# Patient Record
Sex: Male | Born: 1990 | Race: White | Hispanic: No | Marital: Single | Smoking: Current every day smoker
Health system: Southern US, Community
[De-identification: ages and names within clinical notes are randomized; demographics above are authoritative.]

---
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
## 2013-01-16 ENCOUNTER — Emergency Department (HOSPITAL_COMMUNITY): Payer: Self-pay

## 2013-01-16 ENCOUNTER — Encounter (HOSPITAL_COMMUNITY): Payer: Self-pay | Admitting: Emergency Medicine

## 2013-01-16 ENCOUNTER — Emergency Department (HOSPITAL_COMMUNITY)
Admission: EM | Admit: 2013-01-16 | Discharge: 2013-01-16 | Disposition: A | Discharge status: Home / Self Care | Payer: Self-pay | Attending: Emergency Medicine | Admitting: Emergency Medicine

## 2013-01-16 DIAGNOSIS — Y92410 Unspecified street and highway as the place of occurrence of the external cause: Secondary | ICD-10-CM | POA: Insufficient documentation

## 2013-01-16 DIAGNOSIS — M25511 Pain in right shoulder: Secondary | ICD-10-CM

## 2013-01-16 DIAGNOSIS — S4980XA Other specified injuries of shoulder and upper arm, unspecified arm, initial encounter: Secondary | ICD-10-CM | POA: Insufficient documentation

## 2013-01-16 DIAGNOSIS — F1010 Alcohol abuse, uncomplicated: Secondary | ICD-10-CM | POA: Insufficient documentation

## 2013-01-16 DIAGNOSIS — Y9389 Activity, other specified: Secondary | ICD-10-CM | POA: Insufficient documentation

## 2013-01-16 DIAGNOSIS — F17200 Nicotine dependence, unspecified, uncomplicated: Secondary | ICD-10-CM | POA: Insufficient documentation

## 2013-01-16 DIAGNOSIS — S46909A Unspecified injury of unspecified muscle, fascia and tendon at shoulder and upper arm level, unspecified arm, initial encounter: Secondary | ICD-10-CM | POA: Insufficient documentation

## 2013-01-16 DIAGNOSIS — M791 Myalgia, unspecified site: Secondary | ICD-10-CM
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
## 2013-01-16 NOTE — Discharge Instructions (Signed)
Please call and followup with health and wellness Center, as well as orthopedics regarding right shoulder discomfort Please rest and stay hydrated Please avoid any strength activity Please ice shoulder at least 3-4 times per day Please perform slow circular motions to the right shoulder to aid in muscular relief Please use ibuprofen as needed for discomfort Please watch for signs of possible concussion-blurred vision, sudden loss of vision, headache, nausea, vomiting Please continue monitor symptoms and if symptoms are to worsen or change (fever greater than 101, chills, chest pain, shortness of breath, difficulty breathing, numbness and arm, weakness, inability to grasp objects, fall, reinjury) please report back to emergency department immediately  Shoulder Pain The shoulder is the joint that connects your arms to your body. The bones that form the shoulder joint include the upper arm bone (humerus), the shoulder blade (scapula), and the collarbone (clavicle). The top of the humerus is shaped like a ball and fits into a rather flat socket on the scapula (glenoid cavity). A combination of muscles and strong, fibrous tissues that connect muscles to bones (tendons) support your shoulder joint and hold the ball in the socket. Small, fluid-filled sacs (bursae) are located in different areas of the joint. They act as cushions between the bones and the overlying soft tissues and help reduce friction between the gliding tendons and the bone as you move your arm. Your shoulder joint allows a wide range of motion in your arm. This range of motion allows you to do things like scratch your back or throw a ball. However, this range of motion also makes your shoulder more prone to pain from overuse and injury. Causes of shoulder pain can originate from both injury and overuse and usually can be grouped in the following four categories:  Redness, swelling, and pain (inflammation) of the tendon (tendinitis) or the  bursae (bursitis).  Instability, such as a dislocation of the joint.  Inflammation of the joint (arthritis).  Broken bone (fracture). HOME CARE INSTRUCTIONS   Apply ice to the sore area.  Put ice in a plastic bag.  Place a towel between your skin and the bag.  Leave the ice on for 15-20 minutes, 3-4 times per day for the first 2 days.  Stop using cold packs if they do not help with the pain.  If you have a shoulder sling or immobilizer, wear it as long as your caregiver instructs. Only remove it to shower or bathe. Move your arm as little as possible, but keep your hand moving to prevent swelling.  Squeeze a soft ball or foam pad as much as possible to help prevent swelling.  Only take over-the-counter or prescription medicines for pain, discomfort, or fever as directed by your caregiver. SEEK MEDICAL CARE IF:   Your shoulder pain increases, or new pain develops in your arm, hand, or fingers.  Your hand or fingers become cold and numb.  Your pain is not relieved with medicines. SEEK IMMEDIATE MEDICAL CARE IF:   Your arm, hand, or fingers are numb or tingling.  Your arm, hand, or fingers are significantly swollen or turn white or blue. MAKE SURE YOU:   Understand these instructions.  Will watch your condition.  Will get help right away if you are not doing well or get worse. Document Released: 12/25/2004 Document Revised: 12/10/2011 Document Reviewed: 03/01/2011 [facility] Patient Information 2014 [place], [place].  RESOURCE GUIDE  Chronic Pain Problems: Contact [facility]  [phone] Patients need to be referred by their primary  care doctor.  Insufficient Money for Medicine: Contact United Way:  call [phone]  No Primary Care Doctor: - Call Health Connect  [phone] - can help you locate a primary care doctor that  accepts your insurance, provides certain services, etc. - Physician Referral Service- [phone]  Agencies that provide  inexpensive medical care: - [facility]  [phone] - [facility]  [phone] - [facility]  [phone] - Women's Clinic  [phone] - Planned Parenthood  [phone] [facility]  [phone]  Medicaid-accepting [facility] Providers: - [facility]- [address]  [phone], Mon-Fri 9am-7pm, Sat 9am-1pm - [facility]- [address] [place], Suite [place]  [phone] - [facility]- [address], Suite [place]  [phone] [facility]- [address]  [phone] - Renaye Rakers- [address] [place], [address], [phone]  Only accepts [place] patients after they have their name  applied to their card  Self Pay (no insurance) in [place]: - Sickle Cell Patients - [facility]  [address] [place], [phone] - [facility]- [address] [place]  [phone]       [facility] [place]- [address]       -     [facility]- see information above (Speak to [employer] if you do not have insurance)       -  [facility]- [address],  [phone]       -  [facility]- [address] [phone]       -  Dr Julio Sicks-  [address], [place], [phone]       -  [facility] - [address], [phone]       -  [facility]- [address] [phone], also [address], [phone]       -     [facility]- [address] [place], [phone], 1st & 3rd Saturday         every month, 10am-1pm  -     [facility]   [address] [place], [place].   Phone:  [phone], Fax:  [phone]. Hours of Operation:  9 am - 6 pm, M-F.  -     [facility]   [address], [place]   Phone: [phone], Fax: [phone]. Hours of Operation:  8:30 am -  5:30 pm, M-F.  [facility] [address] [place], [place] [address] [phone]  [facility] [address] [place], [place] [address] [phone]  1) Find a Doctor and Pay Out of Pocket Although you won't have to find out who is covered by your insurance plan, it is a good idea to ask around and get recommendations. You will then need to call the office and see if the doctor you have chosen will accept you as a new patient and what types of options they offer for patients who are self-pay. Some doctors offer discounts or will set up payment plans for their patients who do not have insurance, but you will need to ask so you aren't surprised when you get to your appointment.  2) Contact Your Local Health  Department Not all health departments have doctors that can see patients for sick visits, but many do, so it is worth a call to see if yours does. If you don't know where your local health department is, you can check in your phone book. The CDC also has a tool to help you locate your state's health department, and many state websites also have listings of all of their local health departments.  3) Find a Walk-in Clinic If your illness is not likely to be very severe or complicated, you may want to try a walk in clinic. These are popping up all over the country in pharmacies, drugstores, and shopping centers. They're usually staffed by nurse practitioners or physician assistants that have been trained to treat common illnesses and complaints. They're usually fairly quick and inexpensive. However, if you have serious medical issues or chronic medical problems, these are probably not your best option  STD Testing - [facility] Department of [facility] [place], STD Clinic, [address] [place], phone [phone] or [phone].  Monday - Friday, call for an appointment. [facility] Department of [employer], STD Clinic,  [address], [place], phone [phone] or [phone].  Monday - Friday, call for an appointment.  Abuse/Neglect: [facility] Child Abuse Hotline (825)384-9573 [facility] Child Abuse Hotline (432)039-2352 (After Hours)  Emergency Shelter:  [facility] [phone]  Maternity Homes: - Room at the [address] [facility] [phone] - [facility] [phone]  MRSA Hotline #:   267-109-1146  Dental Assistance If unable to pay or uninsured, contact:  [facility]. to become qualified for the adult dental clinic.  Patients with Medicaid: [facility] [phone] [address], [phone] [address], [phone]  If unable to pay, or uninsured, contact [facility] [phone] in [place], [phone] in [facility]) to become qualified for the adult dental clinic  [facility] [address] [place], [place] [address] [phone] [email or web address]  Other Proofreader Services: - [facility]- [address] [place], [place], [place], [address], [phone], Ext. [phone], 2nd and 4th Thursday of the month at 6:30am.  10 clients each day by appointment, can sometimes see walk-in patients if someone does not show for an appointment. [facility]- [address] [place]ins [place], [place], [address], [phone] - [facility] [address], [place], [place], [address], [phone] - [facility]- [phone] [facility]- [phone] [facility] Health [phone]       Behavioral Health Resources in the [facility]  Intensive Outpatient Programs: [facility]      [address] [place], [place] [phone] Both a day and evening program       [facility] Outpatient     [address]        [place], [place] [address] [phone]         ADS:  [facility] [address] [place] [phone]  [facility] ACCESS LINE: [phone] or [phone] [address] [place], [place] [address] [email or web address]   Substance Abuse Resources: - [facility] Drug [facility]  [phone] - [facility] [phone] - [facility] [phone] Floydene Flock [phone] - Residential & Outpatient Substance Abuse Program  [phone]  Psychological Services: [facility]  [phone] [facility]  [phone] [facility], [phone] [address], [place], ACCESS LINE: [phone] or [phone], [email or web address]  Mobile Crisis Teams:                                        Therapeutic Alternatives         Mobile Crisis Care Unit [phone]             [facility] [address] Ginette Otto [phone]                                         Interventionist [address] [place] [phone]  Self-Help/Support Groups: Mental Health Assoc. of [employer] of support groups [phone] (call for more info)  Narcotics Anonymous (NA) [facility] [address] [place] - 2 meetings at this location  Residential Treatment Programs:  [facility]      [address]        [place]       [phone]         [facility] [address] [place] [address] [place], [place]  [address] [phone]  [facility]  [address] [place], [place] [address] [phone] Admissions: 8am-3pm M-F  [facility]     [address]        [place], [place] [address]       [phone]         [facility] [address] [place]ling Manns [place], [place] [phone]  [facility] [address] [place],  [place] [phone]  [facility] - Intensive Outpatient      [address]     [place], [place]       [phone]         [facility]     [address] [place], [place] [phone] or [phone]  Residential Treatment Services (RTS), Medicaid [address] [place], [place] [phone]  [facility] [address]                                               [address] [place] [phone]  [facility] Resources: [phone]               General Therapy                                                Angie Fava, PhD        [address] [place], [place] [address]         [phone]   Insurance  [facility]   [address] [place], [place] [address] [phone]  [facility] [address] [place], [place] [address] [phone]  Insurance/Medicaid/sponsorship through [employer] and Families                                              [address]                                        [place], [place] [address]    Therapy/tele-psych/case         [phone]          [facility] [address], [place]  [address]  Adolescent/group home/case management [phone]                                           Creola Corn PhD       General therapy       Insurance   [phone]         Dr. Lolly Mustache, [place], M-F [phone]  [facility]  United Way [facility] Dept. [address]                 [address]         [address] [place] [address]  Blondell Reveal Phone:  [phone]                                  Phone:  [phone]                   Phone:  [phone]  [facility], [phone] - [facility] - [facility]-  [phone]       -     [facility] in [place], [address]             [phone], Insurance  [place] Child Abuse Hotline (684) 683-7793 or 9512382144 (After Hours)
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
## 2013-01-20 NOTE — ED Provider Notes (Signed)
Medical screening examination/treatment/procedure(s) were performed by non-physician practitioner and as supervising physician I was immediately available for consultation/collaboration.  EKG Interpretation   None        Jaionna Weisse, MD 01/20/13 1356 

## 2015-04-18 ENCOUNTER — Encounter (HOSPITAL_COMMUNITY): Payer: Self-pay

## 2015-04-18 ENCOUNTER — Emergency Department (HOSPITAL_COMMUNITY): Payer: Self-pay

## 2015-04-18 ENCOUNTER — Emergency Department (HOSPITAL_COMMUNITY)
Admission: EM | Admit: 2015-04-18 | Discharge: 2015-04-18 | Disposition: A | Discharge status: Home / Self Care | Payer: Self-pay | Attending: Emergency Medicine | Admitting: Emergency Medicine

## 2015-04-18 DIAGNOSIS — R109 Unspecified abdominal pain: Secondary | ICD-10-CM

## 2015-04-18 DIAGNOSIS — R1013 Epigastric pain: Secondary | ICD-10-CM | POA: Insufficient documentation

## 2015-04-18 DIAGNOSIS — R1032 Left lower quadrant pain: Secondary | ICD-10-CM | POA: Insufficient documentation

## 2015-04-18 DIAGNOSIS — F17210 Nicotine dependence, cigarettes, uncomplicated: Secondary | ICD-10-CM | POA: Insufficient documentation

## 2015-04-18 DIAGNOSIS — R1110 Vomiting, unspecified: Secondary | ICD-10-CM | POA: Insufficient documentation

## 2015-04-18 LAB — DIFFERENTIAL
BASOS ABS: 0 10*3/uL (ref 0.0–0.1)
Basophils Relative: 0 %
EOS PCT: 1 %
Eosinophils Absolute: 0.1 10*3/uL (ref 0.0–0.7)
LYMPHS PCT: 16 %
Lymphs Abs: 2.3 10*3/uL (ref 0.7–4.0)
Monocytes Absolute: 1.1 10*3/uL — ABNORMAL HIGH (ref 0.1–1.0)
Monocytes Relative: 8 %
NEUTROS ABS: 10.3 10*3/uL — AB (ref 1.7–7.7)
NEUTROS PCT: 75 %

## 2015-04-18 LAB — URINALYSIS, ROUTINE W REFLEX MICROSCOPIC
BILIRUBIN URINE: NEGATIVE
GLUCOSE, UA: NEGATIVE mg/dL
HGB URINE DIPSTICK: NEGATIVE
KETONES UR: NEGATIVE mg/dL
Leukocytes, UA: NEGATIVE
Nitrite: NEGATIVE
PH: 8.5 — AB (ref 5.0–8.0)
Protein, ur: NEGATIVE mg/dL
SPECIFIC GRAVITY, URINE: 1.016 (ref 1.005–1.030)

## 2015-04-18 LAB — COMPREHENSIVE METABOLIC PANEL
ALBUMIN: 4.3 g/dL (ref 3.5–5.0)
ALK PHOS: 56 U/L (ref 38–126)
ALT: 15 U/L — ABNORMAL LOW (ref 17–63)
ANION GAP: 10 (ref 5–15)
AST: 20 U/L (ref 15–41)
BILIRUBIN TOTAL: 1.2 mg/dL (ref 0.3–1.2)
BUN: 9 mg/dL (ref 6–20)
CO2: 28 mmol/L (ref 22–32)
Calcium: 9.6 mg/dL (ref 8.9–10.3)
Chloride: 105 mmol/L (ref 101–111)
Creatinine, Ser: 1.03 mg/dL (ref 0.61–1.24)
GFR calc Af Amer: >60 mL/min (ref >60)
GFR calc non Af Amer: >60 mL/min (ref >60)
GLUCOSE: 96 mg/dL (ref 65–99)
POTASSIUM: 3.6 mmol/L (ref 3.5–5.1)
SODIUM: 143 mmol/L (ref 135–145)
Total Protein: 6.5 g/dL (ref 6.5–8.1)

## 2015-04-18 LAB — URINE MICROSCOPIC-ADD ON: Squamous Epithelial / LPF: NONE SEEN

## 2015-04-18 LAB — CBC
HEMATOCRIT: 45.8 % (ref 39.0–52.0)
HEMOGLOBIN: 15.8 g/dL (ref 13.0–17.0)
MCH: 33.5 pg (ref 26.0–34.0)
MCHC: 34.5 g/dL (ref 30.0–36.0)
MCV: 97 fL (ref 78.0–100.0)
Platelets: 184 10*3/uL (ref 150–400)
RBC: 4.72 MIL/uL (ref 4.22–5.81)
RDW: 12.4 % (ref 11.5–15.5)
WBC: 14.2 10*3/uL — ABNORMAL HIGH (ref 4.0–10.5)

## 2015-04-18 LAB — LIPASE, BLOOD: Lipase: 25 U/L (ref 11–51)

## 2015-04-18 MED ORDER — DICYCLOMINE HCL 20 MG PO TABS: 20.0000 mg | ORAL_TABLET | Freq: Two times a day (BID) | ORAL | Status: AC

## 2015-04-18 MED ORDER — DICYCLOMINE HCL 10 MG/ML IM SOLN
20.0000 mg | Freq: Once | INTRAMUSCULAR | Status: AC
  Administered 2015-04-18: 20 mg via INTRAMUSCULAR
  Filled 2015-04-18: qty 2

## 2015-04-18 MED ORDER — ONDANSETRON HCL 4 MG/2ML IJ SOLN
4.0000 mg | Freq: Once | INTRAMUSCULAR | Status: AC
  Administered 2015-04-18: 4 mg via INTRAVENOUS
  Filled 2015-04-18: qty 2

## 2015-04-18 MED ORDER — IOHEXOL 300 MG/ML  SOLN
25.0000 mL | Freq: Once | INTRAMUSCULAR | Status: AC | PRN
  Administered 2015-04-18: 25 mL via ORAL

## 2015-04-18 MED ORDER — SODIUM CHLORIDE 0.9 % IV BOLUS (SEPSIS)
1000.0000 mL | Freq: Once | INTRAVENOUS | Status: AC
  Administered 2015-04-18: 1000 mL via INTRAVENOUS

## 2015-04-18 MED ORDER — IOHEXOL 300 MG/ML  SOLN
100.0000 mL | Freq: Once | INTRAMUSCULAR | Status: AC | PRN
  Administered 2015-04-18: 100 mL via INTRAVENOUS

## 2015-04-18 MED ORDER — ONDANSETRON HCL 4 MG PO TABS: 4.0000 mg | ORAL_TABLET | Freq: Four times a day (QID) | ORAL | Status: AC

## 2015-04-18 NOTE — ED Notes (Signed)
Patient resting at this time; visitor laying on stretcher with patient

## 2015-04-18 NOTE — ED Notes (Signed)
Pt transported to CT ?

## 2015-04-18 NOTE — ED Notes (Signed)
Pt verbalized understanding of d/c instructions, prescriptions, and follow-up care. No further questions/concerns, VSS, ambulatory w/ steady gait (refused wheelchair) 

## 2015-04-18 NOTE — ED Notes (Signed)
Pt woke up at 0300 and went to the bathroom and started vomiting. Having LLQ pain. No diarrhea.

## 2015-04-18 NOTE — Discharge Instructions (Signed)
Mr. Darrell Gomez,  Nice meeting you! Please follow-up with your primary care provider. Return to the emergency department if you develop fevers, chills, increased abdominal pain, or are unable to keep foods down. Feel better soon!  S. Lane Hacker, PA-C

## 2015-04-18 NOTE — ED Notes (Signed)
CT notified pt finished contrast  

## 2015-04-18 NOTE — ED Notes (Signed)
Pt tolerated PO fluids well  

## 2015-04-18 NOTE — ED Provider Notes (Signed)
CSN: [id number]     Arrival date & time 04/18/15  0449 History   First MD Initiated Contact with Patient 04/18/15 0602     Chief Complaint  Patient presents with  . Abdominal Pain  . Emesis   HPI Darrell Gomez is a 25 y.o. M with no significant PMH presenting with abdominal pain and emesis since last night. He states the pain woke him up out of sleep last night, is epigastric and LLQ in location, non-radiating, 6-7/10 pain scale, intermittent, stabbing, not associated with BM or eating. He denies fevers, chills, SOB, CP, ill contacts, recent abx use, recent travel, diarrhea, drinking last night.   History reviewed. No pertinent past medical history. History reviewed. No pertinent past surgical history. No family history on file. Social History  Substance Use Topics  . Smoking status: Current Every Day Smoker -- 1.00 packs/day    Types: Cigarettes  . Smokeless tobacco: None  . Alcohol Use: Yes     Comment: occasionally    Review of Systems  Ten systems are reviewed and are negative for acute change except as noted in the HPI  Allergies  Review of patient's allergies indicates no known allergies.  Home Medications   Prior to Admission medications   Not on File   BP 117/70 mmHg  Pulse 79  Temp(Src) 98.5 F (36.9 C) (Oral)  Resp 20  Ht  (1.753 m)  SpO2 98% Physical Exam  Constitutional: He appears well-developed and well-nourished. No distress.  HENT:  Head: Normocephalic and atraumatic.  Mouth/Throat: Oropharynx is clear and moist. No oropharyngeal exudate.  Eyes: Conjunctivae are normal. Pupils are equal, round, and reactive to light. Right eye exhibits no discharge. Left eye exhibits no discharge. No scleral icterus.  Neck: No tracheal deviation present.  Cardiovascular: Normal rate, regular rhythm, normal heart sounds and intact distal pulses.  Exam reveals no gallop and no friction rub.   No murmur heard. Pulmonary/Chest: Effort normal and breath sounds  normal. No respiratory distress. He has no wheezes. He has no rales. He exhibits no tenderness.  Abdominal: Soft. Bowel sounds are normal. He exhibits no distension and no mass. There is tenderness. There is no rebound and no guarding.  Epigastric and LLQ tenderness  Musculoskeletal: He exhibits no edema.  Lymphadenopathy:    He has no cervical adenopathy.  Neurological: He is alert. Coordination normal.  Skin: Skin is warm and dry. No rash noted. He is not diaphoretic. No erythema.  Psychiatric: He has a normal mood and affect. His behavior is normal.  Nursing note and vitals reviewed.   ED Course  Procedures  Labs Review Labs Reviewed  COMPREHENSIVE METABOLIC PANEL - Abnormal; Notable for the following:    ALT 15 (*)    All other components within normal limits  CBC - Abnormal; Notable for the following:    WBC 14.2 (*)    All other components within normal limits  URINALYSIS, ROUTINE W REFLEX MICROSCOPIC (NOT AT [facility]) - Abnormal; Notable for the following:    APPearance TURBID (*)    pH 8.5 (*)    All other components within normal limits  URINE MICROSCOPIC-ADD ON - Abnormal; Notable for the following:    Bacteria, UA FEW (*)    All other components within normal limits  LIPASE, BLOOD    Imaging Review Ct Abdomen Pelvis W Contrast  04/18/2015  CLINICAL DATA:  Mid abdominal and left lower quadrant pain with nausea and vomiting for 1 day EXAM: CT ABDOMEN AND  PELVIS WITH CONTRAST TECHNIQUE: Multidetector CT imaging of the abdomen and pelvis was performed using the standard protocol following bolus administration of intravenous contrast. Oral contrast was also administered. CONTRAST:  OMNIPAQUE IOHEXOL 300 MG/ML  SOLN COMPARISON:  None. FINDINGS: Lower chest:  Lung bases are clear. Hepatobiliary: No focal liver lesions are identified. Gallbladder wall is not appreciably thickened. There is no biliary duct dilatation. Pancreas: There is no pancreatic mass or inflammatory focus.  Spleen: No splenic lesions identified. Adrenals/Urinary Tract: Adrenals appear normal bilaterally. Kidneys bilaterally show no mass or hydronephrosis on either side. There is a 2 mm nonobstructing calculus in the mid to lower right kidney. No other intrarenal calculi are identified. No ureteral calculi are identified on either side. Urinary bladder is midline with wall thickness within normal limits. Stomach/Bowel: There is no bowel wall or mesenteric thickening. No bowel obstruction. No free air or portal venous air. Vascular/Lymphatic: There is no abdominal aortic aneurysm. The major mesenteric vessels are patent. There is no demonstrable adenopathy in the abdomen or pelvis. Reproductive: Prostate and seminal vesicles appear within normal limits. No pelvic mass or pelvic fluid collection. Other: There is no periappendiceal region inflammatory change. No abscess or ascites in the abdomen or pelvis. Musculoskeletal: There are no blastic or lytic bone lesions. No intramuscular or abdominal wall lesion. IMPRESSION: 2 mm nonobstructing calculus mid to lower pole right kidney region. No hydronephrosis or ureteral calculus on either side. No mesenteric inflammation.  No bowel obstruction.  No abscess. Electronically Signed   By: Bretta Bang III M.D.   On: 04/18/2015 08:12   I have personally reviewed and evaluated these images and lab results as part of my medical decision-making.  MDM   Final diagnoses:  Abdominal pain   Patient non-toxic appearing and VSS. Based on patient history and physical exam, most likely etiologies are dehydration vs diverticulitis vs enteritis. Less likely etiologies are kidney stone, appendicitis.  CMP, UA, lipase unremarkable. CBC with slight leukocytosis of 14.2 CT abdomen/pelvis remarkable for non-obstructing kidney stone but unremarkable for acute change. Discussed results with patient, who is feeling better upon reassessment.  Patient may be safely discharged home with  bentyl and zofran. Discussed reasons for return. Patient to follow-up with primary care provider within one week. Patient in understanding and agreement with the plan.   Melton Krebs, PA-C 04/20/15 0865  Gilda Crease, MD 04/26/15 [phone]

## 2016-10-30 IMAGING — CT CT ABD-PELV W/ CM
2 of 4 series · 11 of 46 positions shown, 12 images · IV contrast (Iodine)
Comparison: None.

CLINICAL DATA: Mid abdominal and left lower quadrant pain with
nausea and vomiting for 1 day

EXAM:
CT ABDOMEN AND PELVIS WITH CONTRAST
TECHNIQUE: Multidetector CT imaging of the abdomen and pelvis was performed
using the standard protocol following bolus administration of
intravenous contrast. Oral contrast was also administered.
CONTRAST:  100mL OMNIPAQUE IOHEXOL 300 MG/ML  SOLN

[Series 201: routine, idose (2) · axial · 0.78mm/px · z∈[-774,-414]mm · 8 of 88 slices shown, 9 images]
[im 8/88  soft-tissue]
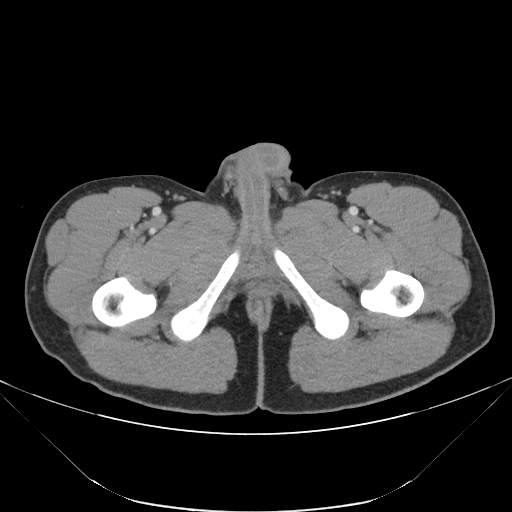
[im 8/88  bone]
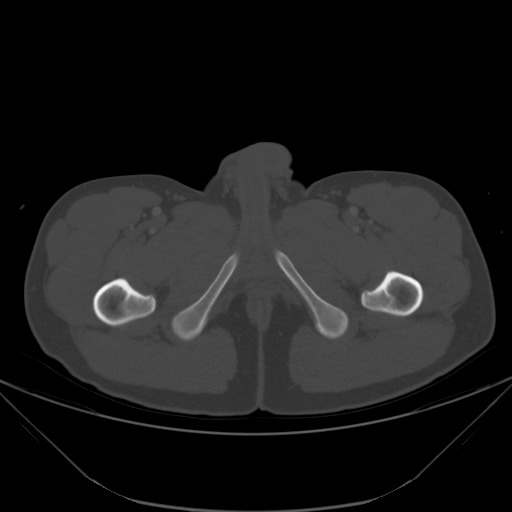
[im 16/88  soft-tissue]
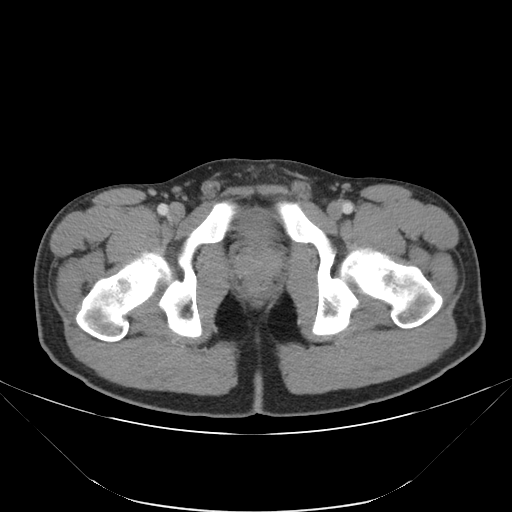
[im 28/88  soft-tissue]
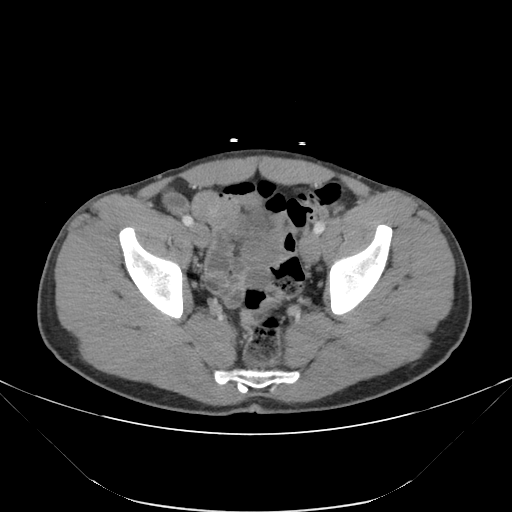
[im 40/88  soft-tissue]
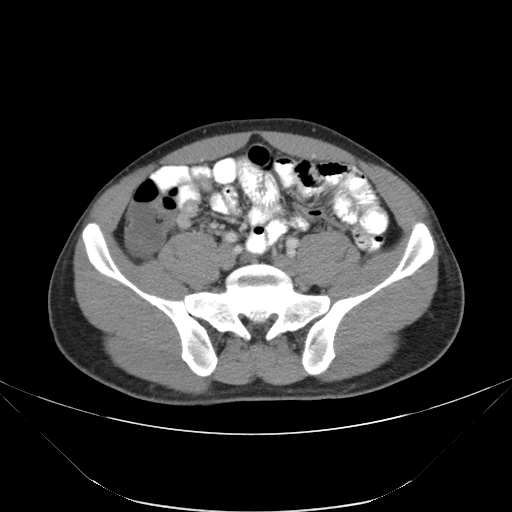
[im 48/88  soft-tissue]
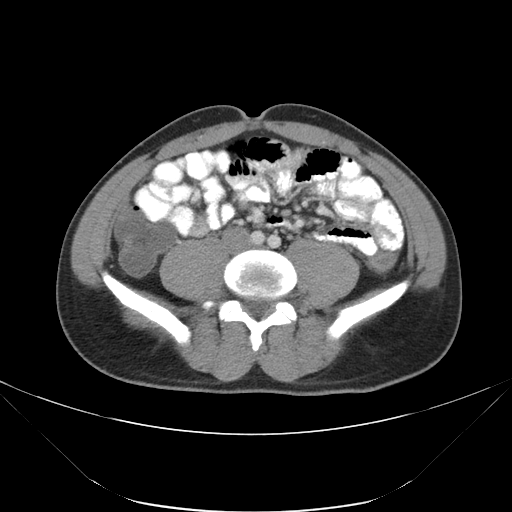
[im 60/88  soft-tissue]
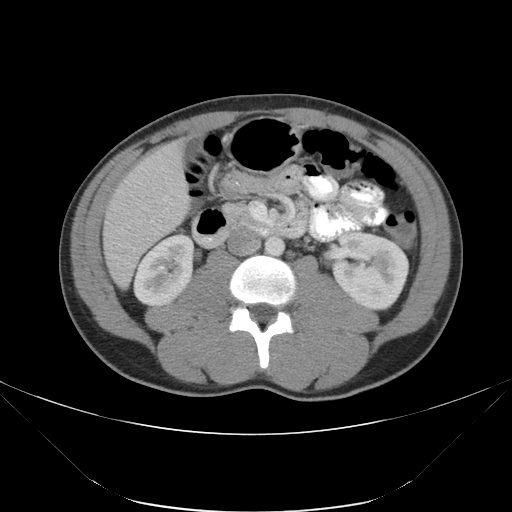
[im 72/88  soft-tissue]
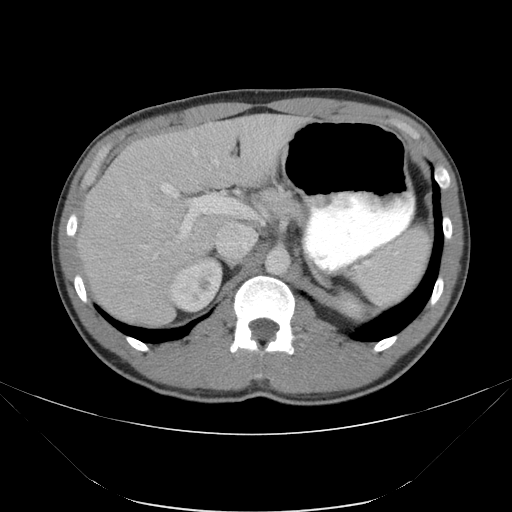
[im 80/88  soft-tissue]
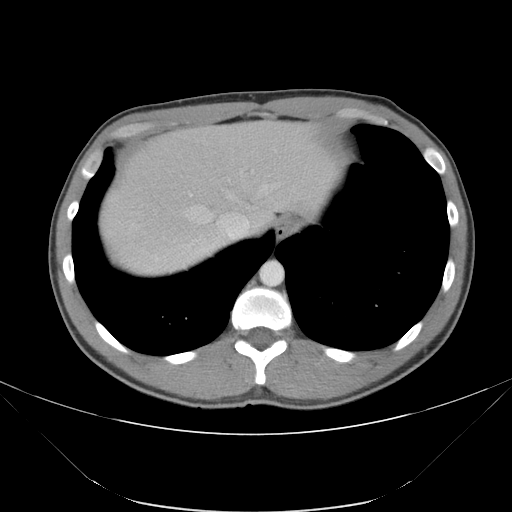

[Series 203: coronals, idose (2) · coronal · 0.45mm/px · 3 of 143 slices shown]
[im 48/143  soft-tissue]
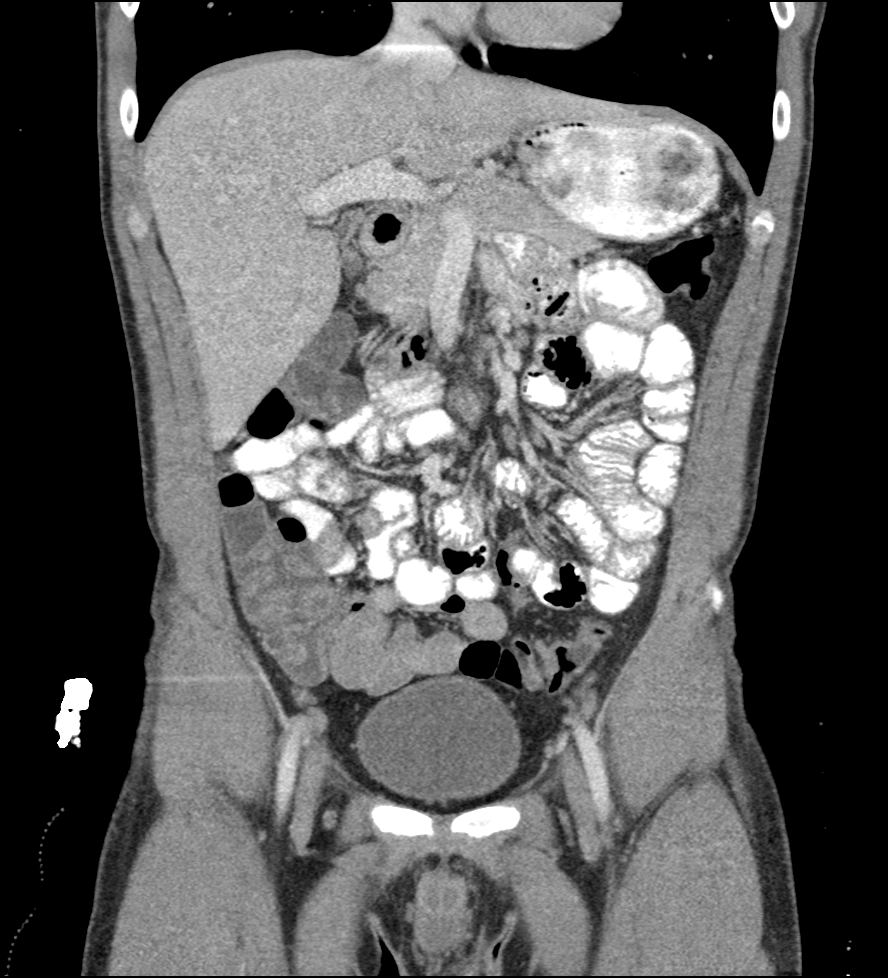
[im 64/143  soft-tissue]
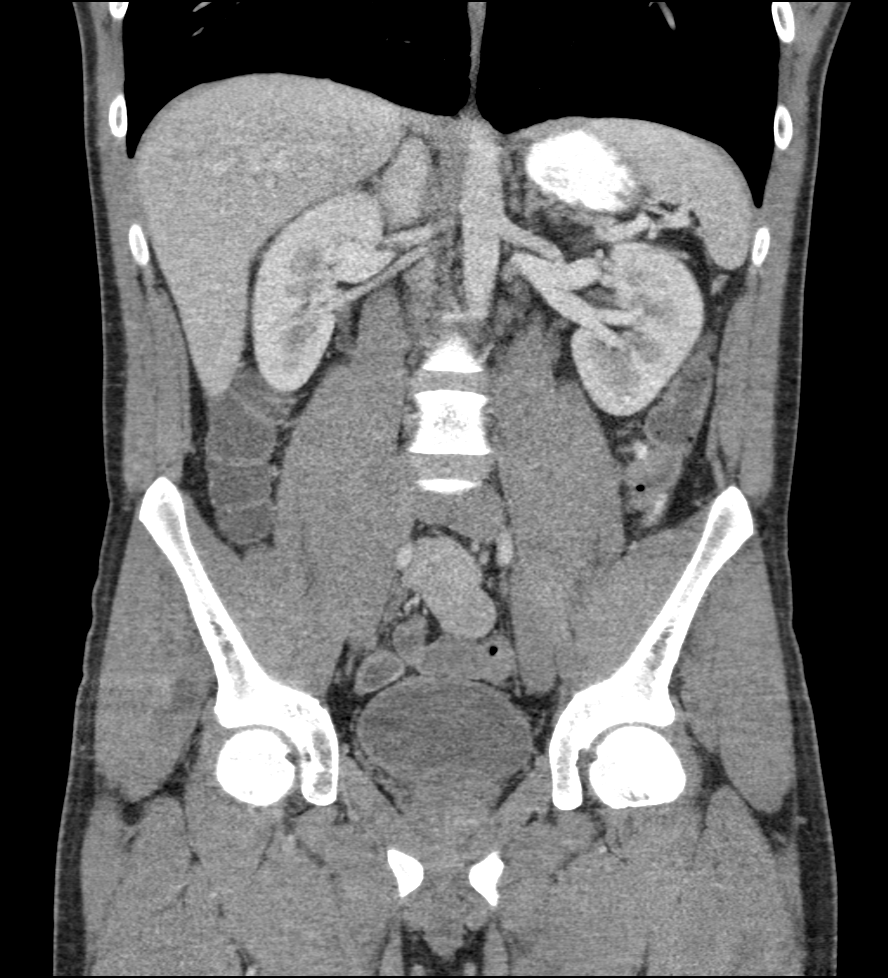
[im 79/143  soft-tissue]
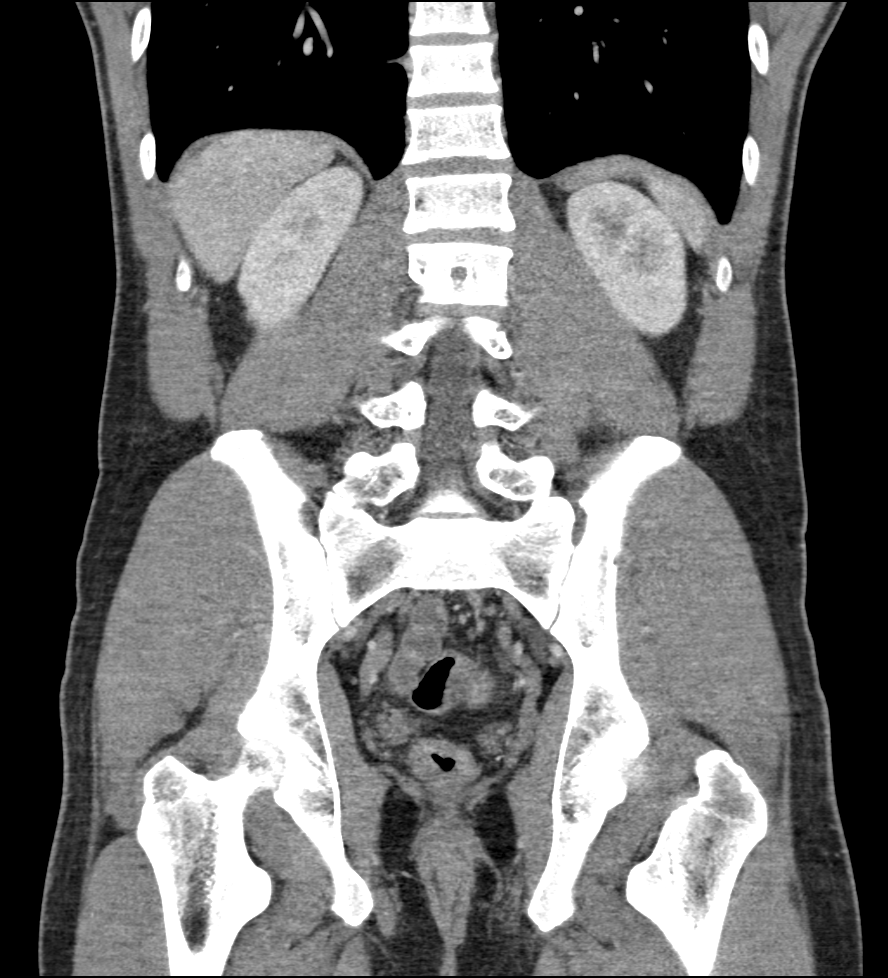

[11 of 46 positions shown; findings below may reference images not displayed]

FINDINGS: Lower chest:  Lung bases are clear.

Hepatobiliary: No focal liver lesions are identified. Gallbladder
wall is not appreciably thickened. There is no biliary duct
dilatation.

Pancreas: There is no pancreatic mass or inflammatory focus.

Spleen: No splenic lesions identified.

Adrenals/Urinary Tract: Adrenals appear normal bilaterally. Kidneys
bilaterally show no mass or hydronephrosis on either side. There is
a 2 mm nonobstructing calculus in the mid to lower right kidney. No
other intrarenal calculi are identified. No ureteral calculi are
identified on either side. Urinary bladder is midline with wall
thickness within normal limits.

Stomach/Bowel: There is no bowel wall or mesenteric thickening. No
bowel obstruction. No free air or portal venous air.

Vascular/Lymphatic: There is no abdominal aortic aneurysm. The major
mesenteric vessels are patent. There is no demonstrable adenopathy
in the abdomen or pelvis.

Reproductive: Prostate and seminal vesicles appear within normal
limits. No pelvic mass or pelvic fluid collection.

Other: There is no periappendiceal region inflammatory change. No
abscess or ascites in the abdomen or pelvis.

Musculoskeletal: There are no blastic or lytic bone lesions. No
intramuscular or abdominal wall lesion.
IMPRESSION: 2 mm nonobstructing calculus mid to lower pole right kidney region.
No hydronephrosis or ureteral calculus on either side.

No mesenteric inflammation.  No bowel obstruction.  No abscess.
